# Patient Record
Sex: Female | Born: 1950 | Race: Black or African American | Hispanic: No | Marital: Single | Smoking: Former smoker
Health system: Southern US, Community
[De-identification: ages and names within clinical notes are randomized; demographics above are authoritative.]

## PROBLEM LIST (undated history)

## (undated) DIAGNOSIS — I499 Cardiac arrhythmia, unspecified: Secondary | ICD-10-CM

## (undated) DIAGNOSIS — I10 Essential (primary) hypertension: Secondary | ICD-10-CM

## (undated) HISTORY — PX: CHOLECYSTECTOMY: SHX55

---

## 2005-06-03 ENCOUNTER — Ambulatory Visit (HOSPITAL_BASED_OUTPATIENT_CLINIC_OR_DEPARTMENT_OTHER): Admission: RE | Admit: 2005-06-03 | Discharge: 2005-06-03 | Payer: Self-pay | Admitting: *Deleted

## 2005-06-08 ENCOUNTER — Ambulatory Visit: Payer: Self-pay | Admitting: Internal Medicine

## 2010-11-12 ENCOUNTER — Emergency Department (HOSPITAL_COMMUNITY)
Admission: EM | Admit: 2010-11-12 | Discharge: 2010-11-12 | Disposition: A | Discharge status: Home / Self Care | Payer: PRIVATE HEALTH INSURANCE | Attending: Emergency Medicine | Admitting: Emergency Medicine

## 2010-11-12 ENCOUNTER — Encounter (HOSPITAL_COMMUNITY): Payer: Self-pay | Admitting: Radiology

## 2010-11-12 ENCOUNTER — Emergency Department (HOSPITAL_COMMUNITY): Payer: PRIVATE HEALTH INSURANCE

## 2010-11-12 DIAGNOSIS — Y929 Unspecified place or not applicable: Secondary | ICD-10-CM | POA: Insufficient documentation

## 2010-11-12 DIAGNOSIS — M545 Low back pain, unspecified: Secondary | ICD-10-CM | POA: Insufficient documentation

## 2010-11-12 DIAGNOSIS — I10 Essential (primary) hypertension: Secondary | ICD-10-CM | POA: Insufficient documentation

## 2010-11-12 DIAGNOSIS — R209 Unspecified disturbances of skin sensation: Secondary | ICD-10-CM | POA: Insufficient documentation

## 2010-11-12 DIAGNOSIS — M546 Pain in thoracic spine: Secondary | ICD-10-CM | POA: Insufficient documentation

## 2010-11-12 DIAGNOSIS — F411 Generalized anxiety disorder: Secondary | ICD-10-CM | POA: Insufficient documentation

## 2010-11-12 DIAGNOSIS — S335XXA Sprain of ligaments of lumbar spine, initial encounter: Secondary | ICD-10-CM | POA: Insufficient documentation

## 2010-11-12 HISTORY — DX: Essential (primary) hypertension: I10

## 2010-11-12 MED ORDER — IOHEXOL 350 MG/ML SOLN
80.0000 mL | Freq: Once | INTRAVENOUS | Status: AC | PRN
  Administered 2010-11-12: 80 mL via INTRAVENOUS

## 2011-07-23 ENCOUNTER — Emergency Department (HOSPITAL_COMMUNITY): Payer: No Typology Code available for payment source

## 2011-07-23 ENCOUNTER — Emergency Department (HOSPITAL_COMMUNITY)
Admission: EM | Admit: 2011-07-23 | Discharge: 2011-07-23 | Disposition: A | Discharge status: Home / Self Care | Payer: No Typology Code available for payment source | Attending: Emergency Medicine | Admitting: Emergency Medicine

## 2011-07-23 ENCOUNTER — Encounter (HOSPITAL_COMMUNITY): Payer: Self-pay

## 2011-07-23 DIAGNOSIS — S22000A Wedge compression fracture of unspecified thoracic vertebra, initial encounter for closed fracture: Secondary | ICD-10-CM

## 2011-07-23 DIAGNOSIS — Y92410 Unspecified street and highway as the place of occurrence of the external cause: Secondary | ICD-10-CM | POA: Insufficient documentation

## 2011-07-23 DIAGNOSIS — Y998 Other external cause status: Secondary | ICD-10-CM | POA: Insufficient documentation

## 2011-07-23 DIAGNOSIS — M25569 Pain in unspecified knee: Secondary | ICD-10-CM | POA: Insufficient documentation

## 2011-07-23 DIAGNOSIS — S8000XA Contusion of unspecified knee, initial encounter: Secondary | ICD-10-CM | POA: Insufficient documentation

## 2011-07-23 DIAGNOSIS — S22009A Unspecified fracture of unspecified thoracic vertebra, initial encounter for closed fracture: Secondary | ICD-10-CM | POA: Insufficient documentation

## 2011-07-23 DIAGNOSIS — T148XXA Other injury of unspecified body region, initial encounter: Secondary | ICD-10-CM

## 2011-07-23 DIAGNOSIS — S8002XA Contusion of left knee, initial encounter: Secondary | ICD-10-CM

## 2011-07-23 HISTORY — DX: Cardiac arrhythmia, unspecified: I49.9

## 2011-07-23 MED ORDER — DIAZEPAM 5 MG PO TABS: ORAL_TABLET | ORAL | Status: AC

## 2011-07-23 MED ORDER — OXYCODONE-ACETAMINOPHEN 5-325 MG PO TABS
1.0000 | ORAL_TABLET | Freq: Once | ORAL | Status: AC
Start: 2011-07-23 — End: 2011-07-23
  Administered 2011-07-23: 1 via ORAL
  Filled 2011-07-23: qty 1

## 2011-07-23 MED ORDER — OXYCODONE-ACETAMINOPHEN 5-325 MG PO TABS: ORAL_TABLET | ORAL | Status: AC

## 2011-07-23 MED ORDER — IBUPROFEN 600 MG PO TABS: 600.0000 mg | ORAL_TABLET | Freq: Four times a day (QID) | ORAL | Status: AC | PRN

## 2011-07-23 NOTE — ED Notes (Signed)
Pt given discharge instructions and verb understanding, amb with steady gait to discharge window 
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
## 2011-07-23 NOTE — ED Provider Notes (Signed)
Medical screening examination/treatment/procedure(s) were performed by non-physician practitioner and as supervising physician I was immediately available for consultation/collaboration.   Laray Anger, DO 07/23/11 [phone]
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
## 2014-07-20 DIAGNOSIS — R739 Hyperglycemia, unspecified: Secondary | ICD-10-CM | POA: Diagnosis not present

## 2014-07-20 DIAGNOSIS — Z0000 Encounter for general adult medical examination without abnormal findings: Secondary | ICD-10-CM | POA: Diagnosis not present

## 2014-07-20 DIAGNOSIS — E782 Mixed hyperlipidemia: Secondary | ICD-10-CM | POA: Diagnosis not present

## 2014-07-20 DIAGNOSIS — M1991 Primary osteoarthritis, unspecified site: Secondary | ICD-10-CM | POA: Diagnosis not present

## 2014-08-09 DIAGNOSIS — Z1211 Encounter for screening for malignant neoplasm of colon: Secondary | ICD-10-CM | POA: Diagnosis not present

## 2014-08-09 DIAGNOSIS — R05 Cough: Secondary | ICD-10-CM | POA: Diagnosis not present

## 2014-08-09 DIAGNOSIS — R509 Fever, unspecified: Secondary | ICD-10-CM | POA: Diagnosis not present

## 2014-08-10 DIAGNOSIS — Z1231 Encounter for screening mammogram for malignant neoplasm of breast: Secondary | ICD-10-CM | POA: Diagnosis not present

## 2014-08-15 DIAGNOSIS — M1991 Primary osteoarthritis, unspecified site: Secondary | ICD-10-CM | POA: Diagnosis not present

## 2014-08-15 DIAGNOSIS — R739 Hyperglycemia, unspecified: Secondary | ICD-10-CM | POA: Diagnosis not present

## 2014-08-15 DIAGNOSIS — E782 Mixed hyperlipidemia: Secondary | ICD-10-CM | POA: Diagnosis not present

## 2014-08-24 DIAGNOSIS — Z1211 Encounter for screening for malignant neoplasm of colon: Secondary | ICD-10-CM | POA: Diagnosis not present

## 2014-11-22 ENCOUNTER — Ambulatory Visit: Payer: Self-pay | Admitting: Internal Medicine

## 2016-12-10 DIAGNOSIS — M25561 Pain in right knee: Secondary | ICD-10-CM | POA: Diagnosis not present

## 2016-12-10 DIAGNOSIS — M25562 Pain in left knee: Secondary | ICD-10-CM | POA: Diagnosis not present

## 2016-12-10 DIAGNOSIS — M170 Bilateral primary osteoarthritis of knee: Secondary | ICD-10-CM | POA: Diagnosis not present

## 2016-12-15 DIAGNOSIS — M1712 Unilateral primary osteoarthritis, left knee: Secondary | ICD-10-CM | POA: Diagnosis not present

## 2016-12-15 DIAGNOSIS — M25562 Pain in left knee: Secondary | ICD-10-CM | POA: Diagnosis not present

## 2016-12-16 DIAGNOSIS — M1711 Unilateral primary osteoarthritis, right knee: Secondary | ICD-10-CM | POA: Diagnosis not present

## 2016-12-16 DIAGNOSIS — M25561 Pain in right knee: Secondary | ICD-10-CM | POA: Diagnosis not present

## 2016-12-24 DIAGNOSIS — M25561 Pain in right knee: Secondary | ICD-10-CM | POA: Diagnosis not present

## 2016-12-24 DIAGNOSIS — M170 Bilateral primary osteoarthritis of knee: Secondary | ICD-10-CM | POA: Diagnosis not present

## 2016-12-24 DIAGNOSIS — M25562 Pain in left knee: Secondary | ICD-10-CM | POA: Diagnosis not present

## 2017-04-15 DIAGNOSIS — M21162 Varus deformity, not elsewhere classified, left knee: Secondary | ICD-10-CM | POA: Diagnosis not present

## 2017-04-15 DIAGNOSIS — M21161 Varus deformity, not elsewhere classified, right knee: Secondary | ICD-10-CM | POA: Diagnosis not present

## 2017-04-15 DIAGNOSIS — M170 Bilateral primary osteoarthritis of knee: Secondary | ICD-10-CM | POA: Diagnosis not present

## 2017-08-25 DIAGNOSIS — M1712 Unilateral primary osteoarthritis, left knee: Secondary | ICD-10-CM | POA: Diagnosis not present

## 2017-08-25 DIAGNOSIS — M170 Bilateral primary osteoarthritis of knee: Secondary | ICD-10-CM | POA: Diagnosis not present

## 2017-08-25 DIAGNOSIS — M25562 Pain in left knee: Secondary | ICD-10-CM | POA: Diagnosis not present

## 2017-08-25 DIAGNOSIS — M25561 Pain in right knee: Secondary | ICD-10-CM | POA: Diagnosis not present

## 2017-09-08 DIAGNOSIS — M25562 Pain in left knee: Secondary | ICD-10-CM | POA: Diagnosis not present

## 2017-09-08 DIAGNOSIS — M1712 Unilateral primary osteoarthritis, left knee: Secondary | ICD-10-CM | POA: Diagnosis not present

## 2017-09-22 DIAGNOSIS — M25562 Pain in left knee: Secondary | ICD-10-CM | POA: Diagnosis not present

## 2017-09-22 DIAGNOSIS — M1712 Unilateral primary osteoarthritis, left knee: Secondary | ICD-10-CM | POA: Diagnosis not present

## 2017-12-29 DIAGNOSIS — M25562 Pain in left knee: Secondary | ICD-10-CM | POA: Diagnosis not present

## 2017-12-29 DIAGNOSIS — M1712 Unilateral primary osteoarthritis, left knee: Secondary | ICD-10-CM | POA: Diagnosis not present

## 2018-01-05 DIAGNOSIS — M25562 Pain in left knee: Secondary | ICD-10-CM | POA: Diagnosis not present

## 2018-01-05 DIAGNOSIS — M1712 Unilateral primary osteoarthritis, left knee: Secondary | ICD-10-CM | POA: Diagnosis not present

## 2018-01-11 DIAGNOSIS — M1712 Unilateral primary osteoarthritis, left knee: Secondary | ICD-10-CM | POA: Diagnosis not present

## 2018-01-11 DIAGNOSIS — M25562 Pain in left knee: Secondary | ICD-10-CM | POA: Diagnosis not present

## 2018-01-20 DIAGNOSIS — M1712 Unilateral primary osteoarthritis, left knee: Secondary | ICD-10-CM | POA: Diagnosis not present

## 2018-01-20 DIAGNOSIS — M25562 Pain in left knee: Secondary | ICD-10-CM | POA: Diagnosis not present

## 2018-01-25 DIAGNOSIS — M1712 Unilateral primary osteoarthritis, left knee: Secondary | ICD-10-CM | POA: Diagnosis not present

## 2018-01-25 DIAGNOSIS — M25562 Pain in left knee: Secondary | ICD-10-CM | POA: Diagnosis not present

## 2018-04-05 ENCOUNTER — Other Ambulatory Visit: Payer: Self-pay | Admitting: Family Medicine

## 2018-04-05 DIAGNOSIS — Z1231 Encounter for screening mammogram for malignant neoplasm of breast: Secondary | ICD-10-CM

## 2018-04-08 ENCOUNTER — Other Ambulatory Visit: Payer: Self-pay | Admitting: Family Medicine

## 2018-05-17 ENCOUNTER — Ambulatory Visit
Admission: RE | Admit: 2018-05-17 | Discharge: 2018-05-17 | Disposition: A | Discharge status: Home / Self Care | Payer: Medicare Other | Source: Ambulatory Visit | Attending: Family Medicine | Admitting: Family Medicine

## 2018-05-17 DIAGNOSIS — Z1231 Encounter for screening mammogram for malignant neoplasm of breast: Secondary | ICD-10-CM

## 2019-09-19 IMAGING — MG DIGITAL SCREENING BILATERAL MAMMOGRAM WITH TOMO AND CAD
8 series · 8 of 24 positions shown · non-contrast
Comparison: Previous exam(s).

CLINICAL DATA: Screening.

EXAM:
DIGITAL SCREENING BILATERAL MAMMOGRAM WITH TOMO AND CAD

[L CC synth-2D]
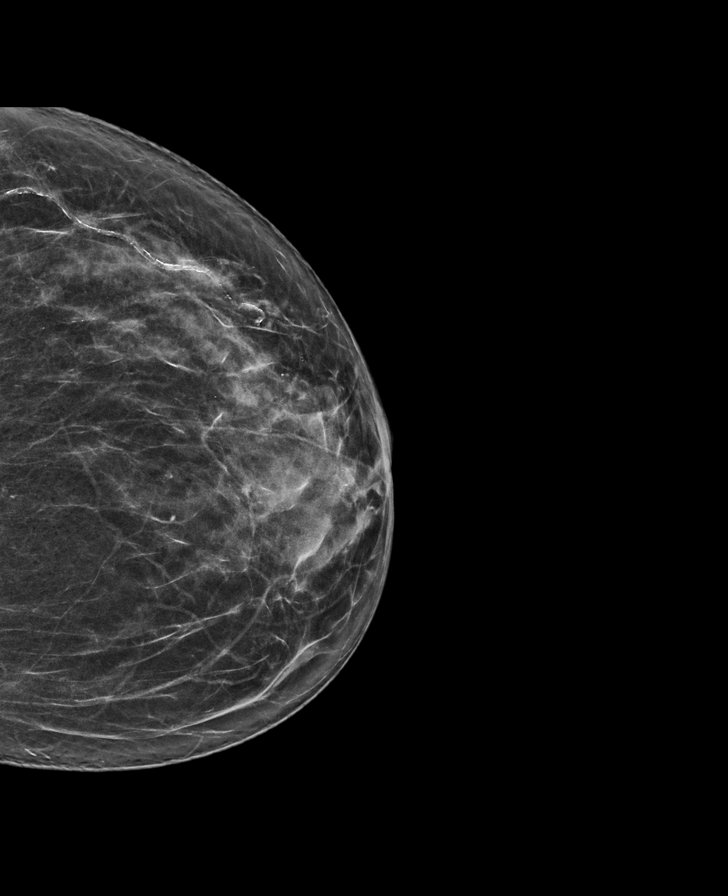

[R MLO synth-2D]
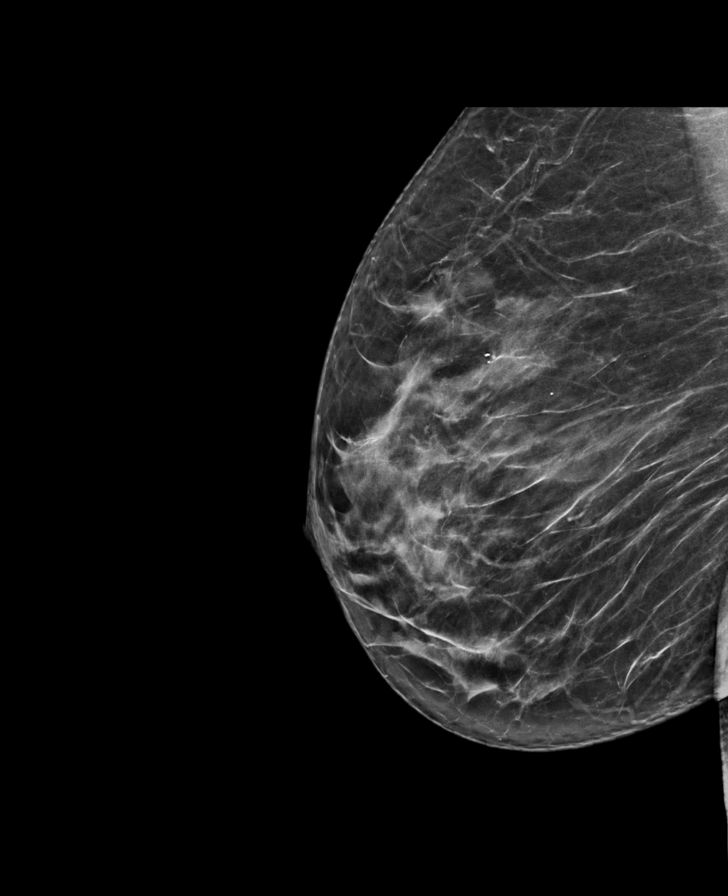

[R CC synth-2D]
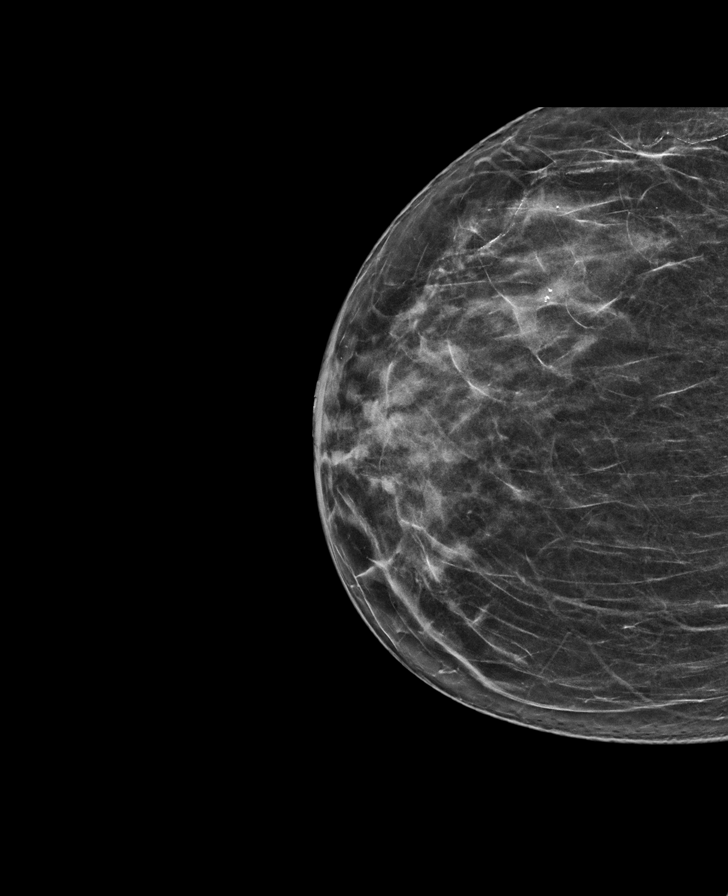

[L MLO synth-2D]
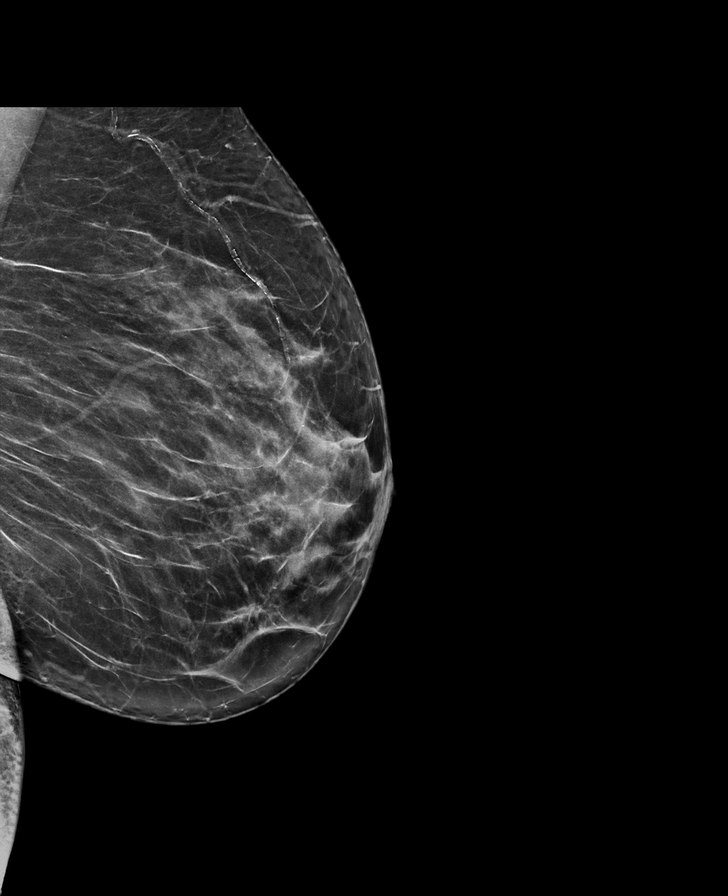

[L MLO tomo · tomo slice 37/73.0]
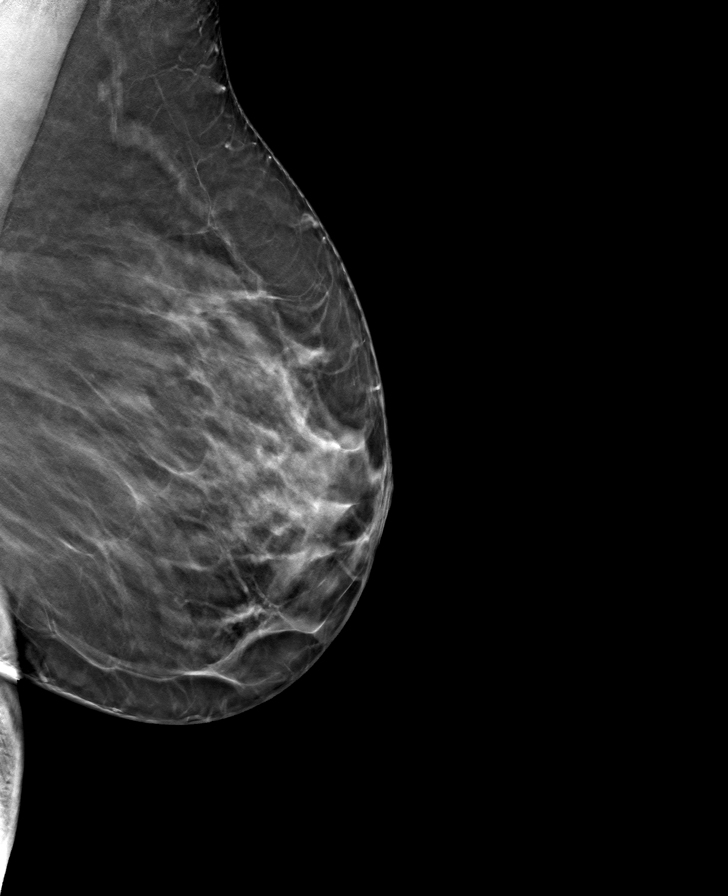

[L CC tomo · tomo slice 32/63.0]
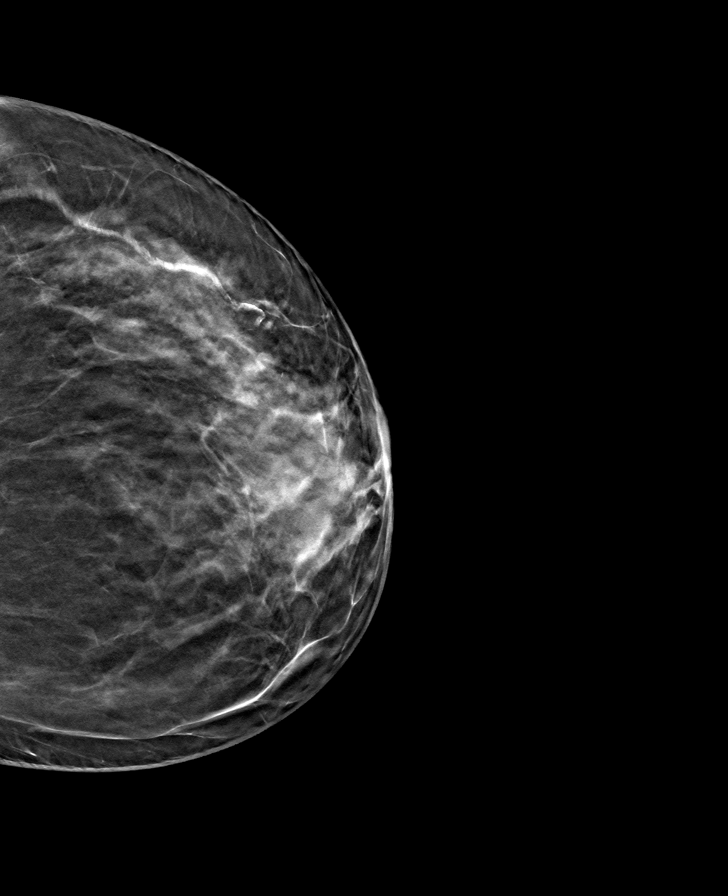

[R MLO tomo · tomo slice 37/72.0]
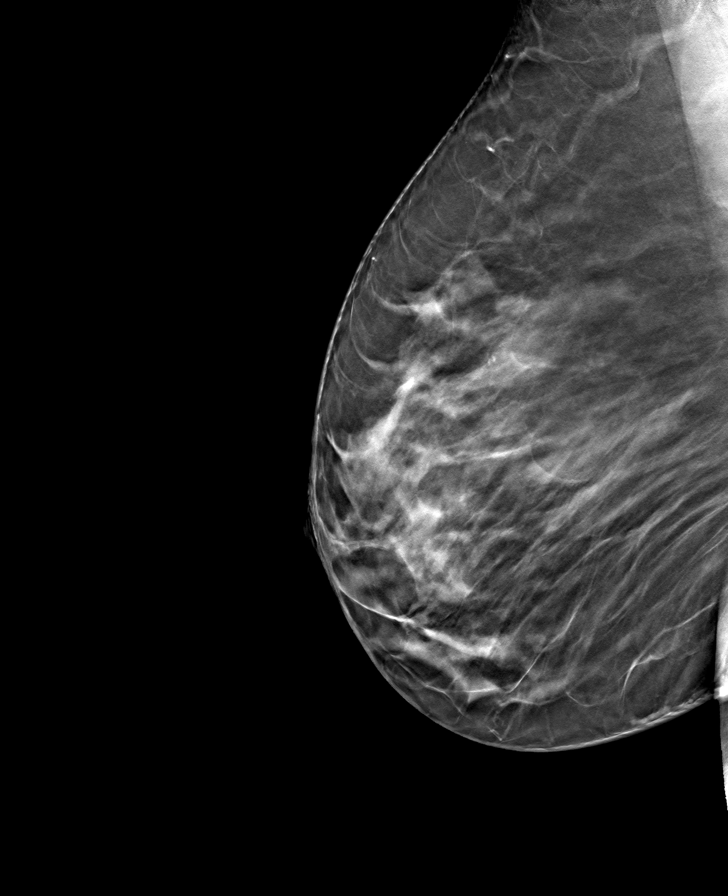

[R CC tomo · tomo slice 31/62.0]
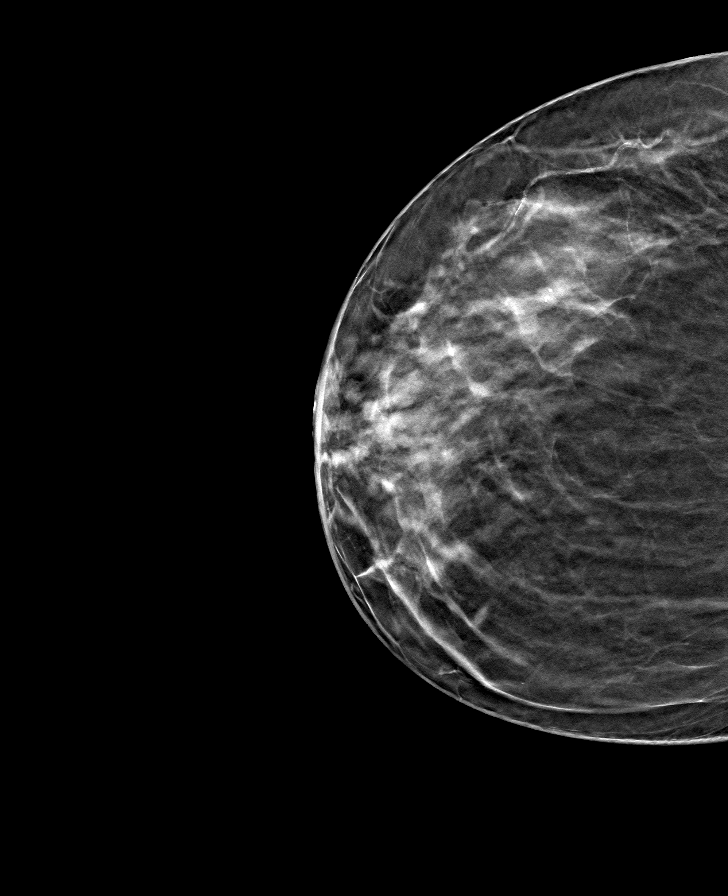

[8 of 24 positions shown; findings below may reference images not displayed]

ACR Breast Density Category b: There are scattered areas of
fibroglandular density.
FINDINGS: There are no findings suspicious for malignancy. Images were
processed with CAD.
IMPRESSION: No mammographic evidence of malignancy. A result letter of this
screening mammogram will be mailed directly to the patient.

RECOMMENDATION:
Screening mammogram in one year. (Code:[id number])

BI-RADS CATEGORY  1: Negative.

## 2019-11-10 ENCOUNTER — Other Ambulatory Visit: Payer: Self-pay | Admitting: Family Medicine

## 2019-11-10 DIAGNOSIS — Z1231 Encounter for screening mammogram for malignant neoplasm of breast: Secondary | ICD-10-CM

## 2019-12-07 ENCOUNTER — Other Ambulatory Visit: Payer: Self-pay

## 2019-12-07 ENCOUNTER — Encounter: Payer: Self-pay | Admitting: Podiatry

## 2019-12-07 ENCOUNTER — Ambulatory Visit (INDEPENDENT_AMBULATORY_CARE_PROVIDER_SITE_OTHER): Payer: Medicare Other | Admitting: Podiatry

## 2019-12-07 DIAGNOSIS — B353 Tinea pedis: Secondary | ICD-10-CM

## 2019-12-07 DIAGNOSIS — B351 Tinea unguium: Secondary | ICD-10-CM

## 2019-12-07 DIAGNOSIS — M79674 Pain in right toe(s): Secondary | ICD-10-CM | POA: Diagnosis not present

## 2019-12-07 DIAGNOSIS — M79675 Pain in left toe(s): Secondary | ICD-10-CM | POA: Diagnosis not present

## 2019-12-07 MED ORDER — KETOCONAZOLE 2 % EX CREA: 1.0000 "application " | TOPICAL_CREAM | Freq: Every day | CUTANEOUS | 2 refills | Status: AC

## 2019-12-07 MED ORDER — TERBINAFINE HCL 250 MG PO TABS: 250.0000 mg | ORAL_TABLET | Freq: Every day | ORAL | 0 refills | Status: AC

## 2019-12-07 NOTE — Progress Notes (Signed)
°  Subjective:  Patient ID: Jill Rivera, female    DOB: 1950/12/30,  MRN: [id number]  CC: Discolored toenails, peeling dressing on right foot  69 y.o. female presents with the above complaint. History confirmed with patient.  She complains of long thick discolored toenails especially the right hallux.  The left hallux is also splitting.  As well as peeling dry skin on the right foot.  She has tried over-the-counter nail fungus treatment such as Vicks VapoRub and tea tree oil with no relief.  Objective:  Physical Exam: warm, good capillary refill, no trophic changes or ulcerative lesions, normal DP and PT pulses and normal sensory exam. Left Foot:  Mycotic hallux nail Right Foot: Mycotic hallux nail, scaling dry skin in a moccasin distribution Assessment:   1. Onychomycosis   2. Pain due to onychomycosis of toenails of both feet   3. Tinea pedis of both feet      Plan:  Patient was evaluated and treated and all questions answered.  Discussed the etiology and treatment options for the condition in detail with the patient. Educated patient on the topical and oral treatment options for mycotic nails. Recommended debridement of the nails today. Sharp and mechanical debridement performed of all painful and mycotic nails today. Nails debrided in length and thickness using a nail nipper and a mechanical burr to level of comfort. Discussed treatment options including appropriate shoe gear. Follow up as needed for painful nails.  Fungal culture was taken.  Begin Lamisil 250 mg x 90 days.  LFTs ordered, she will complete these 6 weeks  Discussed the etiology and treatment options for tinea pedis.  Discussed topical and oral treatment.  Recommended topical treatment with 2% ketoconazole cream.  This was sent to the patient's pharmacy.  Also discussed appropriate foot hygiene, use of antifungal spray such as Tinactin in shoes, as well as cleaning her foot surfaces such as showers and bathroom floors  with bleach.    Return in about 3 months (around 03/08/2020).

## 2020-03-08 ENCOUNTER — Ambulatory Visit: Payer: Medicare Other | Admitting: Podiatry

## 2021-04-05 ENCOUNTER — Other Ambulatory Visit (HOSPITAL_COMMUNITY): Payer: Self-pay | Admitting: Family Medicine

## 2021-04-05 DIAGNOSIS — R9431 Abnormal electrocardiogram [ECG] [EKG]: Secondary | ICD-10-CM

## 2021-04-05 DIAGNOSIS — R000 Tachycardia, unspecified: Secondary | ICD-10-CM

## 2021-04-05 DIAGNOSIS — I10 Essential (primary) hypertension: Secondary | ICD-10-CM

## 2021-04-09 ENCOUNTER — Encounter (HOSPITAL_COMMUNITY): Payer: Self-pay | Admitting: Family Medicine

## 2021-04-18 ENCOUNTER — Telehealth (HOSPITAL_COMMUNITY): Payer: Self-pay | Admitting: Family Medicine

## 2021-04-18 NOTE — Telephone Encounter (Signed)
Just an FYI. We have made several attempts to contact this patient including sending a letter to schedule or reschedule their echocardiogram. We will be removing the patient from the echo WQ.   MAILED LETTER LBW  04/09/21 called and LB x3 @ 10:05am/LBW  04/08/21 Called and LB @ 11:33/LBW  04/05/21 Called and LB @ 12:38/LBW  and 2:57pm  NO PA# ( per verbal from [facility] at PCP office @ 12:28/     Thank you
